# Patient Record
Sex: Male | Born: 1995 | Race: White | Hispanic: No | Marital: Single | Smoking: Never smoker
Health system: Southern US, Community
[De-identification: ages and names within clinical notes are randomized; demographics above are authoritative.]

---

## 2020-03-16 ENCOUNTER — Emergency Department (HOSPITAL_BASED_OUTPATIENT_CLINIC_OR_DEPARTMENT_OTHER): Payer: BC Managed Care – PPO

## 2020-03-16 ENCOUNTER — Emergency Department (HOSPITAL_BASED_OUTPATIENT_CLINIC_OR_DEPARTMENT_OTHER)
Admission: EM | Admit: 2020-03-16 | Discharge: 2020-03-16 | Disposition: A | Discharge status: Home / Self Care | Payer: BC Managed Care – PPO | Attending: Emergency Medicine | Admitting: Emergency Medicine

## 2020-03-16 ENCOUNTER — Other Ambulatory Visit: Payer: Self-pay

## 2020-03-16 ENCOUNTER — Encounter (HOSPITAL_BASED_OUTPATIENT_CLINIC_OR_DEPARTMENT_OTHER): Payer: Self-pay

## 2020-03-16 DIAGNOSIS — S40011A Contusion of right shoulder, initial encounter: Secondary | ICD-10-CM | POA: Diagnosis not present

## 2020-03-16 DIAGNOSIS — Y999 Unspecified external cause status: Secondary | ICD-10-CM | POA: Diagnosis not present

## 2020-03-16 DIAGNOSIS — S4991XA Unspecified injury of right shoulder and upper arm, initial encounter: Secondary | ICD-10-CM | POA: Diagnosis present

## 2020-03-16 DIAGNOSIS — Y929 Unspecified place or not applicable: Secondary | ICD-10-CM | POA: Insufficient documentation

## 2020-03-16 DIAGNOSIS — W010XXA Fall on same level from slipping, tripping and stumbling without subsequent striking against object, initial encounter: Secondary | ICD-10-CM | POA: Diagnosis not present

## 2020-03-16 DIAGNOSIS — Y9366 Activity, soccer: Secondary | ICD-10-CM | POA: Insufficient documentation

## 2020-03-16 NOTE — ED Notes (Signed)
ED Provider at bedside. 

## 2020-03-16 NOTE — ED Provider Notes (Signed)
[facility] EMERGENCY DEPARTMENT Provider Note   CSN: [id number] Arrival date & time: 03/16/20  2018     History Chief Complaint  Patient presents with  . Shoulder Injury    Jacob Jefferson is a 24 y.o. male.  The history is provided by the patient.  Shoulder Injury This is a new problem. The current episode started 1 to 2 hours ago. The problem occurs constantly. The problem has not changed since onset.Pertinent negatives include no chest pain and no shortness of breath. Associated symptoms comments: Patient was playing soccer today when he went to kick a ball and fell directly on his right shoulder.  He felt a pop in the shoulder and has had pain since.  The pain is made worse when he tries to move the shoulder but does not radiate.  He has no numbness or tingling in the right arm.  He denies any weakness.. The symptoms are aggravated by bending. Nothing relieves the symptoms. He has tried nothing for the symptoms. The treatment provided no relief.       History reviewed. No pertinent past medical history.  There are no problems to display for this patient.   History reviewed. No pertinent surgical history.     No family history on file.  Social History   Tobacco Use  . Smoking status: Never Smoker  . Smokeless tobacco: Never Used  Vaping Use  . Vaping Use: Never used  Substance Use Topics  . Alcohol use: Never  . Drug use: Never    Home Medications Prior to Admission medications   Not on File    Allergies    Patient has no known allergies.  Review of Systems   Review of Systems  Respiratory: Negative for shortness of breath.   Cardiovascular: Negative for chest pain.  All other systems reviewed and are negative.   Physical Exam Updated Vital Signs BP (!) 141/81 (BP Location: Left Arm)   Pulse 81   Temp 98.7 F (37.1 C) (Oral)   Resp 18   Ht 6\' 3"  (1.905 m)   Wt 106.6 kg   SpO2 99%   BMI 29.37 kg/m   Physical Exam Vitals and  nursing note reviewed.  Constitutional:      General: He is not in acute distress.    Appearance: Normal appearance. He is well-developed and normal weight.  HENT:     Head: Normocephalic and atraumatic.  Eyes:     Conjunctiva/sclera: Conjunctivae normal.     Pupils: Pupils are equal, round, and reactive to light.  Cardiovascular:     Rate and Rhythm: Normal rate.     Pulses: Normal pulses.  Pulmonary:     Effort: Pulmonary effort is normal. No respiratory distress.  Musculoskeletal:        General: Tenderness present.     Right shoulder: Tenderness and bony tenderness present. No swelling or deformity. Decreased range of motion. Normal strength. Normal pulse.     Comments: Tenderness over the [facility] joint and distal clavicle.  Will not allow passive ROM due to pain.  Skin:    General: Skin is warm and dry.     Findings: No erythema or rash.  Neurological:     Mental Status: He is alert and oriented to person, place, and time.  Psychiatric:        Behavior: Behavior normal.      ED Results / Procedures / Treatments   Labs (all labs ordered are listed, but only abnormal results are  displayed) Labs Reviewed - No data to display  EKG None  Radiology DG Shoulder Right  Result Date: 03/16/2020 CLINICAL DATA:  Pain post right shoulder injury. EXAM: RIGHT SHOULDER - 2+ VIEW COMPARISON:  None. FINDINGS: There is no evidence of fracture or dislocation. There is no evidence of arthropathy or other focal bone abnormality. Soft tissues are unremarkable. IMPRESSION: Negative. Electronically Signed   By: Ted Mcalpine M.D.   On: 03/16/2020 20:54    Procedures Procedures (including critical care time)  Medications Ordered in ED Medications - No data to display  ED Course  I have reviewed the triage vital signs and the nursing notes.  Pertinent labs & imaging results that were available during my care of the patient were reviewed by me and considered in my medical decision  making (see chart for details).    MDM Rules/Calculators/A&P                          Patient presenting today after a fall while playing soccer.  Felt a pop in his right shoulder.  No significant evidence of deformity at this time.  Patient is neurovascularly intact.  No other injury at this time.  X-ray is negative for shoulder dislocation or clavicle fracture.  Patient placed in a sling for comfort, diagnosed with sprain and contusion and to use supportive care and follow-up with orthopedics if symptoms do not improve.  MDM Number of Diagnoses or Management Options   Amount and/or Complexity of Data Reviewed Tests in the radiology section of CPT: ordered and reviewed Obtain history from someone other than the patient: no Independent visualization of images, tracings, or specimens: yes  Risk of Complications, Morbidity, and/or Mortality Presenting problems: low Diagnostic procedures: low Management options: low  Patient Progress Patient progress: stable  Final Clinical Impression(s) / ED Diagnoses Final diagnoses:  Contusion of right shoulder, initial encounter    Rx / DC Orders ED Discharge Orders    None       Gwyneth Sprout, MD 03/16/20 2357

## 2020-03-16 NOTE — ED Triage Notes (Signed)
Pt presents with a shoulder injury sustained while playing soccer. Pt dove for a ball landing on his R shoulder. Pt believes he has a dislocation.

## 2020-03-16 NOTE — Discharge Instructions (Signed)
Use sling for comfort but make sure you are ranging the shoulder.  Take tylenol/ibuprofen for pain and use ice to help with swelling.  No lifting with right arm for the next week.

## 2022-03-12 IMAGING — CR DG SHOULDER 2+V*R*
3 series · 3 of 3 positions shown · non-contrast
Comparison: None.

CLINICAL DATA: Pain post right shoulder injury.

EXAM:
RIGHT SHOULDER - 2+ VIEW

[w shoulder ap internal righ]
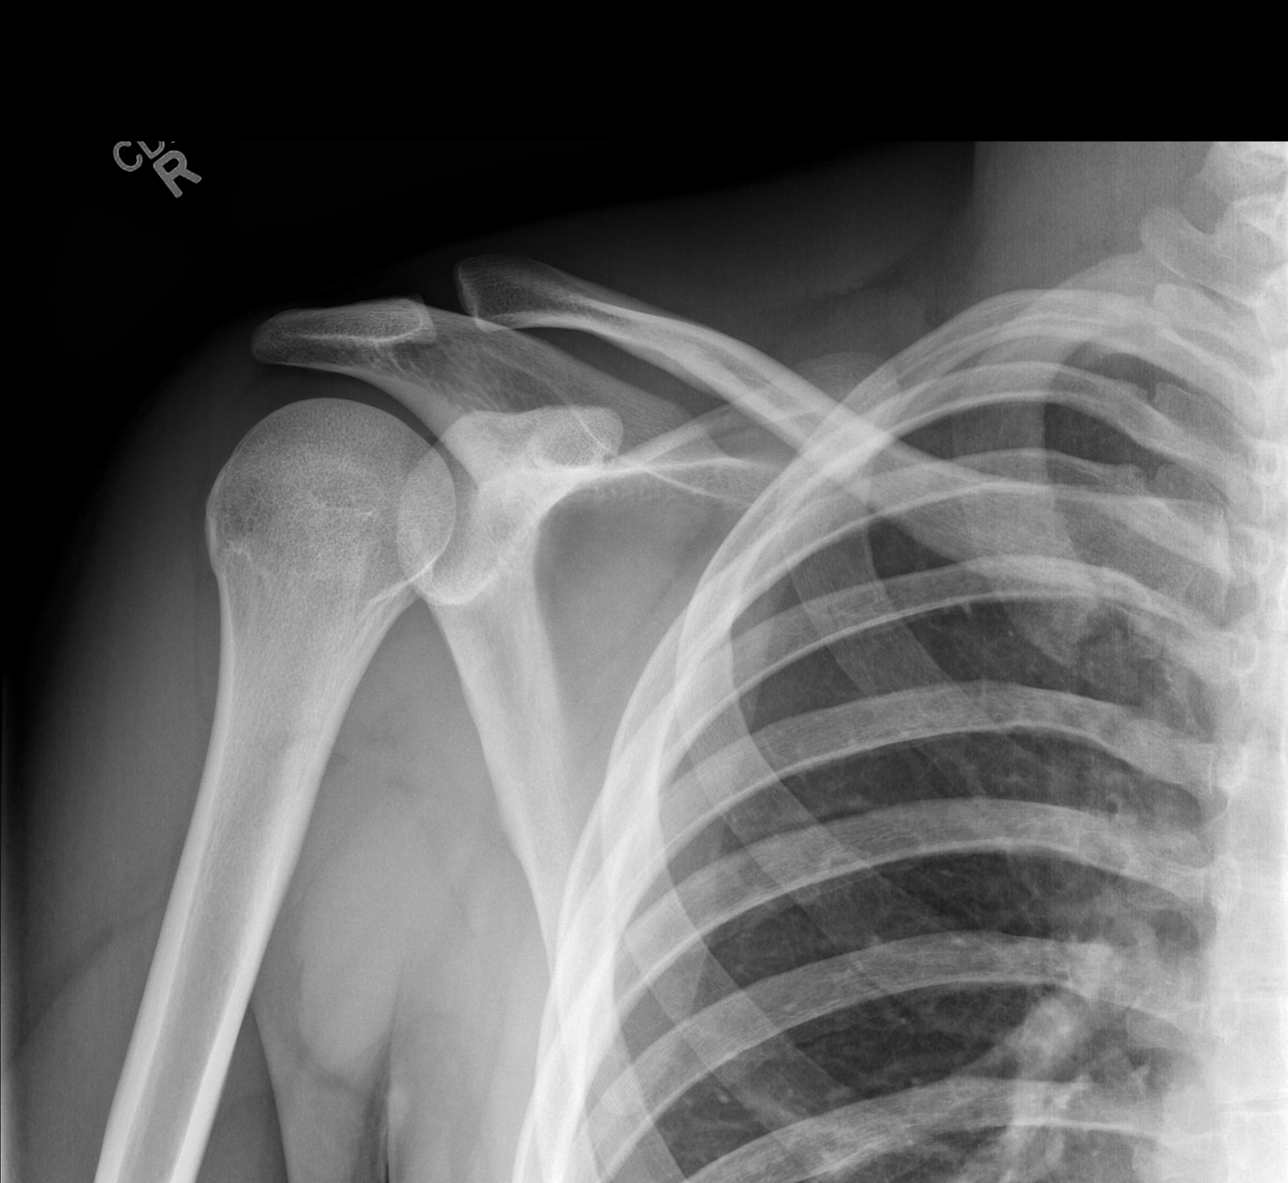

[w shoulder grashey right]
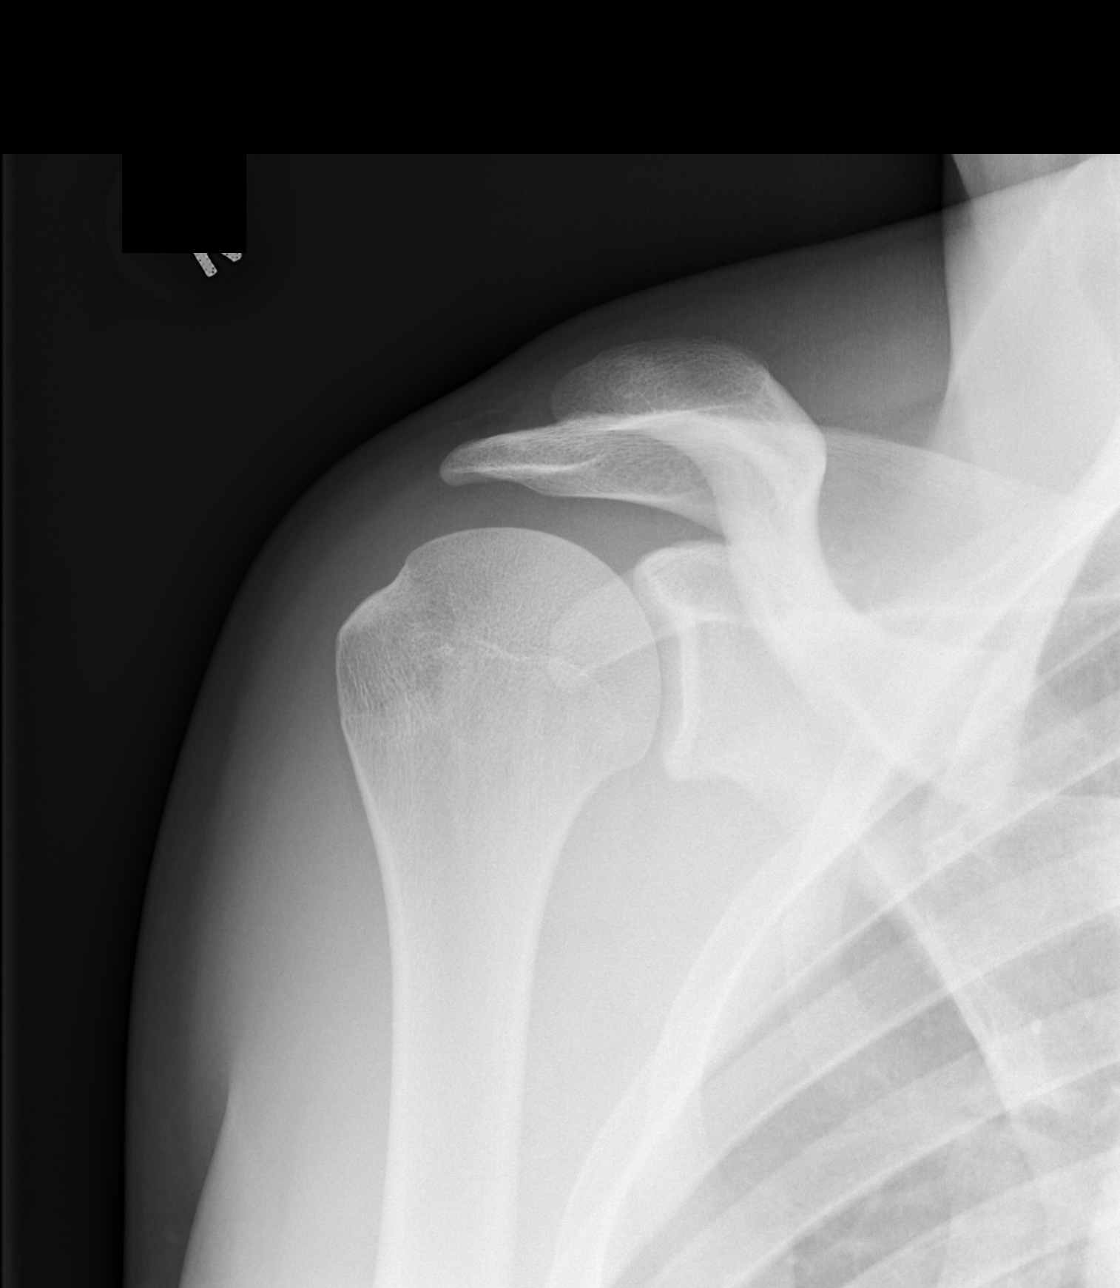

[w shoulder y view right *]
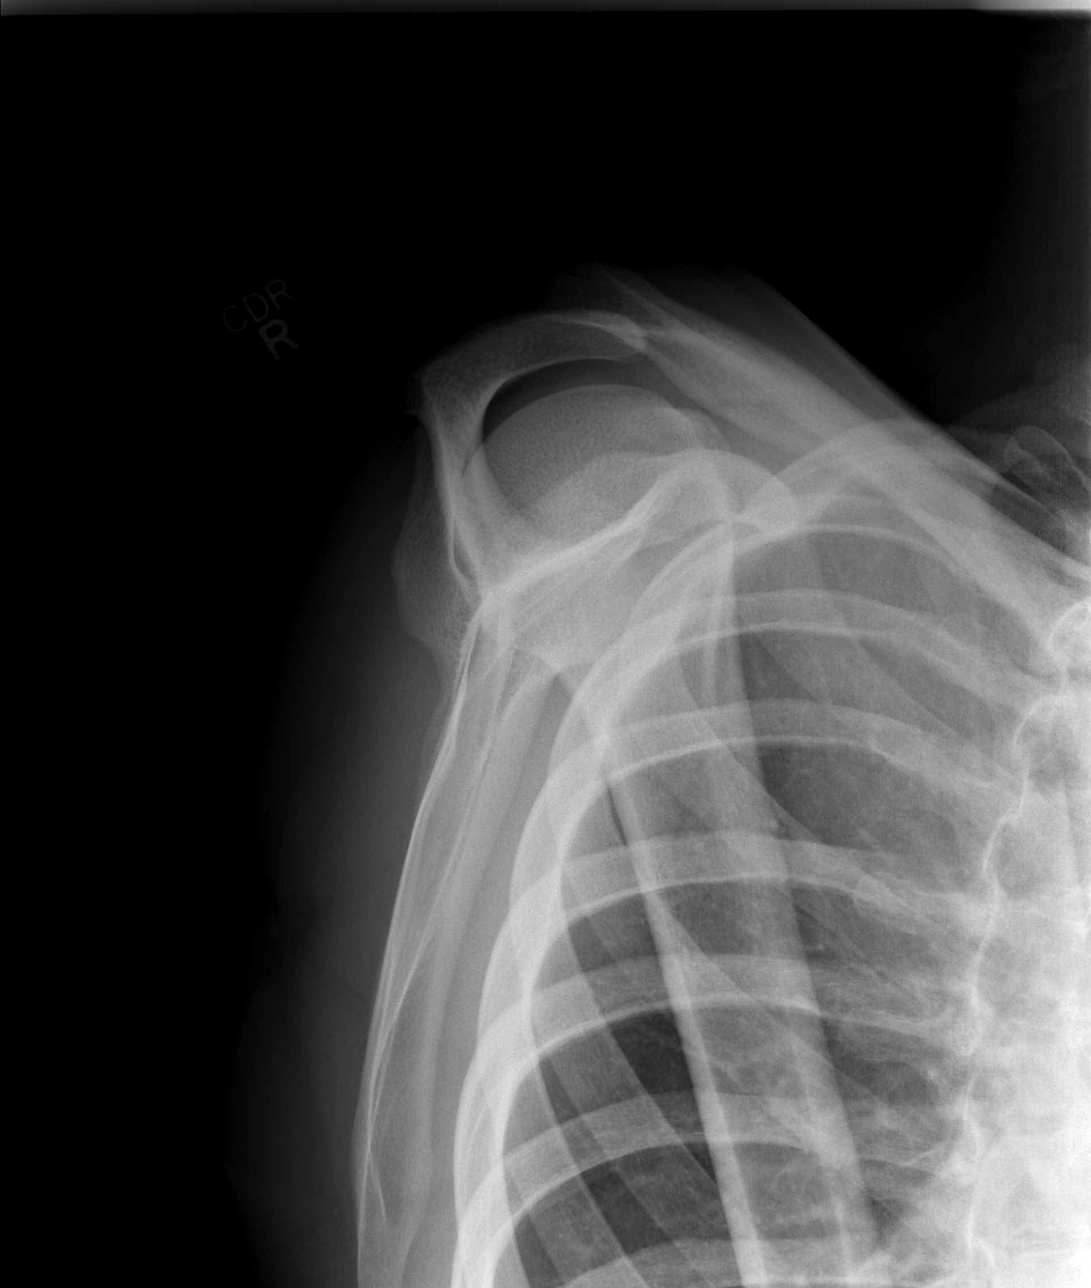

[3 of 3 positions shown; findings below may reference images not displayed]

FINDINGS: There is no evidence of fracture or dislocation. There is no
evidence of arthropathy or other focal bone abnormality. Soft
tissues are unremarkable.
IMPRESSION: Negative.
